# Patient Record
Sex: Male | Born: 1995 | Race: Black or African American | Hispanic: No | Marital: Single | Smoking: Never smoker
Health system: Southern US, Community
[De-identification: ages and names within clinical notes are randomized; demographics above are authoritative.]

## PROBLEM LIST (undated history)

## (undated) ENCOUNTER — Emergency Department (HOSPITAL_COMMUNITY): Admission: EM | Payer: 59

## (undated) DIAGNOSIS — M419 Scoliosis, unspecified: Secondary | ICD-10-CM

## (undated) DIAGNOSIS — M2140 Flat foot [pes planus] (acquired), unspecified foot: Secondary | ICD-10-CM

## (undated) HISTORY — PX: FOOT SURGERY: SHX648

## (undated) HISTORY — PX: HIP FRACTURE SURGERY: SHX118

---
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
## 2013-12-19 ENCOUNTER — Encounter (HOSPITAL_COMMUNITY): Payer: Self-pay | Admitting: Emergency Medicine

## 2013-12-19 ENCOUNTER — Emergency Department (HOSPITAL_COMMUNITY)
Admission: EM | Admit: 2013-12-19 | Discharge: 2013-12-19 | Disposition: A | Discharge status: Home / Self Care | Payer: Federal, State, Local not specified - PPO | Attending: Emergency Medicine | Admitting: Emergency Medicine

## 2013-12-19 DIAGNOSIS — K088 Other specified disorders of teeth and supporting structures: Secondary | ICD-10-CM | POA: Insufficient documentation

## 2013-12-19 DIAGNOSIS — K029 Dental caries, unspecified: Secondary | ICD-10-CM | POA: Insufficient documentation

## 2013-12-19 DIAGNOSIS — K0889 Other specified disorders of teeth and supporting structures: Secondary | ICD-10-CM

## 2013-12-19 MED ORDER — ONDANSETRON HCL 4 MG PO TABS
4.0000 mg | ORAL_TABLET | Freq: Once | ORAL | Status: AC
  Administered 2013-12-19: 4 mg via ORAL
  Filled 2013-12-19: qty 1

## 2013-12-19 MED ORDER — ACETAMINOPHEN-CODEINE #3 300-30 MG PO TABS: 1.0000 | ORAL_TABLET | Freq: Four times a day (QID) | ORAL | Status: DC | PRN

## 2013-12-19 MED ORDER — ACETAMINOPHEN-CODEINE #3 300-30 MG PO TABS
2.0000 | ORAL_TABLET | Freq: Once | ORAL | Status: AC
Start: 2013-12-19 — End: 2013-12-19
  Administered 2013-12-19: 2 via ORAL
  Filled 2013-12-19: qty 2

## 2013-12-19 MED ORDER — AMOXICILLIN 250 MG PO CAPS
500.0000 mg | ORAL_CAPSULE | Freq: Once | ORAL | Status: AC
  Administered 2013-12-19: 500 mg via ORAL
  Filled 2013-12-19: qty 2

## 2013-12-19 MED ORDER — AMOXICILLIN 500 MG PO CAPS: 500.0000 mg | ORAL_CAPSULE | Freq: Three times a day (TID) | ORAL | Status: DC

## 2013-12-19 NOTE — Discharge Instructions (Signed)
Dental Pain  Toothache is pain in or around a tooth. It may get worse with chewing or with cold or heat.   HOME CARE  · Your dentist may use a numbing medicine during treatment. If so, you may need to avoid eating until the medicine wears off. Ask your dentist about this.  · Only take medicine as told by your dentist or doctor.  · Avoid chewing food near the painful tooth until after all treatment is done. Ask your dentist about this.  GET HELP RIGHT AWAY IF:   · The problem gets worse or new problems appear.  · You have a fever.  · There is redness and puffiness (swelling) of the face, jaw, or neck.  · You cannot open your mouth.  · There is pain in the jaw.  · There is very bad pain that is not helped by medicine.  MAKE SURE YOU:   · Understand these instructions.  · Will watch your condition.  · Will get help right away if you are not doing well or get worse.  Document Released: 06/06/2007 Document Revised: 03/12/2011 Document Reviewed: 06/06/2007  Exit[employer]® Patient Information ©2015 [employer]. This information is not intended to replace advice given to you by your health care provider. Make sure you discuss any questions you have with your health care provider.

## 2013-12-19 NOTE — ED Notes (Signed)
Patient c/o right lower dental pain. Patient reports gurgling salt water and taking, tylenol, ibuprofen, and aspirin with some relief.

## 2013-12-19 NOTE — ED Provider Notes (Signed)
CSN: [id number]     Arrival date & time 12/19/13  1331 History   None    Chief Complaint  Patient presents with  . Dental Pain     (Consider location/radiation/quality/duration/timing/severity/associated sxs/prior Treatment) Patient is a [age] y.o. male presenting with tooth pain. The history is provided by the patient.  Dental Pain Location:  Lower Severity:  Moderate Onset quality:  Gradual Timing:  Intermittent Progression:  Worsening Context: dental caries   Relieved by:  Nothing Worsened by:  Cold food/drink Ineffective treatments:  Acetaminophen and NSAIDs Associated symptoms: gum swelling   Associated symptoms: no drooling, no neck pain and no trismus   Risk factors: no diabetes and no smoking     History reviewed. No pertinent past medical history. Past Surgical History  Procedure Laterality Date  . Foot surgery    . Hip fracture surgery Left    History reviewed. No pertinent family history. History  Substance Use Topics  . Smoking status: Never Smoker   . Smokeless tobacco: Never Used  . Alcohol Use: No    Review of Systems  Constitutional: Negative for activity change.       All ROS Neg except as noted in HPI  HENT: Positive for dental problem. Negative for drooling and nosebleeds.   Eyes: Negative for photophobia and discharge.  Respiratory: Negative for cough, shortness of breath and wheezing.   Cardiovascular: Negative for chest pain and palpitations.  Gastrointestinal: Negative for abdominal pain and blood in stool.  Genitourinary: Negative for dysuria, frequency and hematuria.  Musculoskeletal: Positive for arthralgias. Negative for back pain and neck pain.  Skin: Negative.   Neurological: Negative for dizziness, seizures and speech difficulty.  Psychiatric/Behavioral: Negative for hallucinations and confusion.      Allergies  Review of patient's allergies indicates no known allergies.  Home Medications   Prior to Admission medications    Medication Sig Start Date End Date Taking? Authorizing Provider  acetaminophen (TYLENOL) 500 MG tablet Take 500 mg by mouth every 8 (eight) hours as needed (pain).   Yes Historical Provider, MD  ibuprofen (ADVIL,MOTRIN) 200 MG tablet Take 400 mg by mouth every 8 (eight) hours as needed (pain).   Yes Historical Provider, MD   BP 120/80 mmHg  Pulse 69  Temp(Src) 98 F (36.7 C) (Oral)  Resp 16  SpO2 95% Physical Exam  Constitutional: He is oriented to person, place, and time. He appears well-developed and well-nourished.  Non-toxic appearance.  HENT:  Head: Normocephalic.  Right Ear: Tympanic membrane and external ear normal.  Left Ear: Tympanic membrane and external ear normal.  Mouth/Throat: Uvula is midline. No trismus in the jaw. Abnormal dentition. Dental caries present. No dental abscesses.  Eyes: EOM and lids are normal. Pupils are equal, round, and reactive to light.  Neck: Normal range of motion. Neck supple. Carotid bruit is not present.  Cardiovascular: Normal rate, regular rhythm, normal heart sounds, intact distal pulses and normal pulses.   Pulmonary/Chest: Breath sounds normal. No respiratory distress.  Abdominal: Soft. Bowel sounds are normal. There is no tenderness. There is no guarding.  Musculoskeletal: Normal range of motion.  Lymphadenopathy:       Head (right side): No submandibular adenopathy present.       Head (left side): No submandibular adenopathy present.    He has no cervical adenopathy.  Neurological: He is alert and oriented to person, place, and time. He has normal strength. No cranial nerve deficit or sensory deficit.  Skin: Skin is warm and dry.  Psychiatric: He has a normal mood and affect. His speech is normal.  Nursing note and vitals reviewed.   ED Course  Procedures (including critical care time) Labs Review Labs Reviewed - No data to display  Imaging Review No results found.   EKG Interpretation None      MDM  Patient has some  swelling of the gum on the right. There are multiple dental caries present. No abscess. No swelling under the tongue is appreciated. There is no evidence for Ludwig's angina.  Patient strongly encouraged to see a dentist as sone as possible. Prescription for Tylenol codeine and Amoxil given to the patient.    Final diagnoses:  Toothache    *I have reviewed nursing notes, vital signs, and all appropriate lab and imaging results for this patient.[address]**    Raniya Golembeski Garry HeaterM Stachia Slutsky, PA-C 12/21/13 1805  Enid SkeensJoshua M Zavitz, MD 12/22/13 Lyda [id number]

## 2017-07-16 ENCOUNTER — Emergency Department (HOSPITAL_COMMUNITY): Payer: Federal, State, Local not specified - PPO

## 2017-07-16 ENCOUNTER — Emergency Department (HOSPITAL_COMMUNITY)
Admission: EM | Admit: 2017-07-16 | Discharge: 2017-07-16 | Disposition: A | Discharge status: Home / Self Care | Payer: Federal, State, Local not specified - PPO | Attending: Emergency Medicine | Admitting: Emergency Medicine

## 2017-07-16 ENCOUNTER — Other Ambulatory Visit: Payer: Self-pay

## 2017-07-16 ENCOUNTER — Encounter (HOSPITAL_COMMUNITY): Payer: Self-pay | Admitting: *Deleted

## 2017-07-16 DIAGNOSIS — X500XXA Overexertion from strenuous movement or load, initial encounter: Secondary | ICD-10-CM | POA: Insufficient documentation

## 2017-07-16 DIAGNOSIS — S8991XA Unspecified injury of right lower leg, initial encounter: Secondary | ICD-10-CM

## 2017-07-16 DIAGNOSIS — Y929 Unspecified place or not applicable: Secondary | ICD-10-CM | POA: Insufficient documentation

## 2017-07-16 DIAGNOSIS — Y999 Unspecified external cause status: Secondary | ICD-10-CM | POA: Insufficient documentation

## 2017-07-16 DIAGNOSIS — Y9383 Activity, rough housing and horseplay: Secondary | ICD-10-CM | POA: Insufficient documentation

## 2017-07-16 HISTORY — DX: Scoliosis, unspecified: M41.9

## 2017-07-16 HISTORY — DX: Flat foot (pes planus) (acquired), unspecified foot: M21.40

## 2017-07-16 MED ORDER — IBUPROFEN 800 MG PO TABS
800.0000 mg | ORAL_TABLET | Freq: Once | ORAL | Status: AC
  Administered 2017-07-16: 800 mg via ORAL
  Filled 2017-07-16: qty 1

## 2017-07-16 NOTE — ED Provider Notes (Signed)
[facility] EMERGENCY DEPARTMENT Provider Note   CSN: [id number] Arrival date & time: 07/16/17  7829     History   Chief Complaint Chief Complaint  Patient presents with  . Knee Injury    HPI David Hanna is a 22 y.o. male with a history of scoliosis who presents to the emergency department status post right knee injury at 1700 complaining of discomfort.  Patient states he was horsing around with his brother when he fell onto the right knee.  He states he got a popping sensation and had immediate onset of discomfort.  States pain is most prominent to the anterior aspect of the knee.  Rates pain a 9 out of 10 in severity, worse with movement and with walking, however he has been able to bear weight.  No specific alleviating factors.  No medications prior to arrival.  No other areas of injury.  No Head injury or loss of consciousness with fall.  Denies numbness, weakness, or tingling.  HPI  Past Medical History:  Diagnosis Date  . Flat foot   . Scoliosis     Patient Active Problem List   Diagnosis Date Noted  . Fracture of wrist 05/11/2010    Past Surgical History:  Procedure Laterality Date  . FOOT SURGERY    . HIP FRACTURE SURGERY Left         Home Medications    Prior to Admission medications   Medication Sig Start Date End Date Taking? Authorizing Provider  acetaminophen (TYLENOL) 500 MG tablet Take 500 mg by mouth every 8 (eight) hours as needed (pain).    [provider]  acetaminophen-codeine (TYLENOL #3) 300-30 MG per tablet Take 1-2 tablets by mouth every 6 (six) hours as needed. 12/19/13   Ivery Quale, PA-C  amoxicillin (AMOXIL) 500 MG capsule Take 1 capsule (500 mg total) by mouth 3 (three) times daily. 12/19/13   Ivery Quale, PA-C  ibuprofen (ADVIL,MOTRIN) 200 MG tablet Take 400 mg by mouth every 8 (eight) hours as needed (pain).    [provider]    Family History No family history on file.  Social History Social History     Tobacco Use  . Smoking status: Never Smoker  . Smokeless tobacco: Never Used  Substance Use Topics  . Alcohol use: No  . Drug use: Never     Allergies   Patient has no known allergies.   Review of Systems Review of Systems  Musculoskeletal: Positive for arthralgias. Negative for back pain and neck pain.  Skin: Negative for color change and wound.  Neurological: Negative for weakness, numbness and headaches.     Physical Exam Updated Vital Signs BP 139/70 (BP Location: Right Arm)   Pulse (!) 58   Temp 98.1 F (36.7 C) (Oral)   Resp 17   Ht 6\' 4"  (1.93 m)   Wt 108.9 kg (240 lb)   SpO2 99%   BMI 29.21 kg/m   Physical Exam  Constitutional: He appears well-developed and well-nourished. No distress.  HENT:  Head: Normocephalic and atraumatic. Head is without raccoon's eyes and without Battle's sign.  Eyes: Pupils are equal, round, and reactive to light. Conjunctivae are normal. Right eye exhibits no discharge. Left eye exhibits no discharge.  Neck: Normal range of motion. Neck supple. No spinous process tenderness present.  Cardiovascular:  Pulses:      Dorsalis pedis pulses are 2+ on the right side, and 2+ on the left side.       Posterior tibial  pulses are 2+ on the right side, and 2+ on the left side.  Pulmonary/Chest: Effort normal and breath sounds normal.  Abdominal: Soft. Normal appearance. He exhibits no distension.  Musculoskeletal:  Upper extremities: Normal range of motion.  Nontender. Back: No midline tenderness to palpation Lower extremities: Some soft tissue swelling is present to anterior right knee.  No open wounds.  No obvious deformity.  No ecchymosis. Patient has normal range of motion to bilateral hips, ankles and the left knee.  Patient's right knee range of motion is somewhat limited, he is able to flex just past 90 degrees, he is able to extend to about 10 degrees (+)- cannot get to fully straight secondary to discomfort.  Compartments are soft.   Patient is tender to palpation diffusely to the anterior knee as well as the medial and lateral aspects.  Tender over the patella as well as the medial and lateral joint line.  Mildly tender to the proximal tibia consistent with tibal tuberosity area.  No tenderness to the fibular head.  No other areas of tenderness to the lower extremities.  Neurovascularly intact distally.  Neurological: He is alert.  Clear speech.  Sensation grossly intact bilateral upper and lower extremities.  Patient has 5 out of 5 strength with knee flexion and extension and ankle plantar dorsiflexion bilaterally.  Symmetric.  Gait is intact but antalgic.  Psychiatric: He has a normal mood and affect. His behavior is normal. Thought content normal.  Nursing note and vitals reviewed.    ED Treatments / Results  Labs (all labs ordered are listed, but only abnormal results are displayed) Labs Reviewed - No data to display  EKG None  Radiology Dg Knee Complete 4 Views Right  Result Date: 07/16/2017 CLINICAL DATA:  Right knee pain after a fall/landing on it wrong today. Initial encounter. EXAM: RIGHT KNEE - COMPLETE 4+ VIEW COMPARISON:  None. FINDINGS: There is a 3 x 1 mm calcific focus at the lower pole of the patella which could represent a tiny avulsion fracture. The proximal portion of the patellar tendon is poorly defined with adjacent fat stranding. There may be a small knee joint effusion. There is no dislocation, and there is no other evidence of fracture. The patella appears slightly high riding. Femorotibial joint space widths are preserved. Several small foci of calcification are noted in the soft tissues lateral to the distal femur, nonspecific. 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 Correlate for signs of patellar tendon injury. Electronically Signed   By: Sebastian Ache M.D.   On: 07/16/2017 19:25    Procedures Procedures (including critical  care time) .Splint Application Date/Time: 07/16/2017 8:26 PM Performed by: ED RN Authorized by: Cherly Anderson, PA-C   Consent:    Consent obtained:  Verbal   Consent given by:  Patient   Risks discussed:  Discoloration, numbness, pain and swelling   Alternatives discussed:  No treatment Pre-procedure details:    Sensation:  Normal Procedure details:    Laterality:  Right   Location:  Knee   Knee:  R knee   Splint type:  Knee immobilizer Post-procedure details:    Sensation:  Normal   Patient tolerance of procedure:  Tolerated well, no immediate complications   Medications Ordered in ED Medications  ibuprofen (ADVIL,MOTRIN) tablet 800 mg (800 mg Oral Given 07/16/17 2047)     Initial Impression / Assessment and Plan / ED Course  I have reviewed the  triage vital signs and the nursing notes.  Pertinent labs & imaging results that were available during my care of the patient were reviewed by me and considered in my medical decision making (see chart for details).   Patient presents to the emergency department status post right knee injury.  Patient nontoxic-appearing, no apparent distress, vitals without significant abnormality.  No evidence of serious head neck or back injury status post fall.  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 Correlate for signs of patellar tendon injury-patient is able to extend/flex the right knee (mild limitation in ROM), he has 5 out of 5 symmetric strength with flexion and extension therefore do not suspect complete tear of patellar tendon. He is NVI distally. Will place in knee immobilizer, crutches, PRICE, and ibuprofen with orthopedics follow up. I discussed results, treatment plan, need for orthopedics follow-up, and return precautions with the patient and his mother at bedside. Provided opportunity for questions, patient and his mother  confirmed understanding and are in agreement with plan.   Findings and plan of care discussed with supervising physician Dr. Particia NearingHaviland- in agreement.    Final Clinical Impressions(s) / ED Diagnoses   Final diagnoses:  Injury of right knee, initial encounter    ED Discharge Orders    None       Desmond Lopeetrucelli, Dejha King R, PA-C 07/16/17 2138    Jacalyn LefevreHaviland, Julie, MD 07/16/17 2327

## 2017-07-16 NOTE — Discharge Instructions (Signed)
Please read and follow all provided instructions.  You have been seen today for an injury to your right knee   Tests performed today include: An x-ray of the affected area - shows that there is a possible small avulsion fracture of the patella (the bone in the front of your knee)  Vital signs. See below for your results today.   Home care instructions: -- *PRICE in the first 24-48 hours after injury: Protect (with brace, splint, sling), if given by your provider- please wear brace at all times until you have seen orthopedic doctor.  Rest- do not participate in strenuous activities or sports until you have seen the orthopedic doctor.  Ice- Do not apply ice pack directly to your skin, place towel or similar between your skin and ice/ice pack. Apply ice for 20 min, then remove for 40 min while awake Compression- Wear brace, elastic bandage, splint as directed by your provider Elevate affected extremity above the level of your heart when not walking around for the first 24-48 hours   Use Ibuprofen (Motrin/Advil) 600mg  every 6 hours as needed for pain (do not exceed max dose in 24 hours, 2400mg )  Follow-up instructions: Please follow-up with your primary care provider or the provided orthopedic physician in 3-5 days for re-evaluation. Remain in the brace and avoid strenuous activity until you have seen this doctor. Call the office to make an appointment.   Return instructions:  Please return if your toes or feet are numb or tingling, appear gray or blue, or you have severe pain (also elevate the leg and loosen splint or wrap if you were given one) Please return to the Emergency Department if you experience worsening symptoms.  Please return if you have any other emergent concerns. Additional Information:  Your vital signs today were: BP 139/70 (BP Location: Right Arm)    Pulse (!) 58    Temp 98.1 F (36.7 C) (Oral)    Resp 17    Ht 6\' 4"  (1.93 m)    Wt 108.9 kg (240 lb)    SpO2 99%    BMI  29.21 kg/m  If your blood pressure (BP) was elevated above 135/85 this visit, please have this repeated by your doctor within one month. ---------------

## 2017-07-16 NOTE — ED Triage Notes (Signed)
Pt c/o right knee pain after falling and "landing wrong" on it.

## 2019-02-01 IMAGING — DX DG KNEE COMPLETE 4+V*R*
4 series · 4 of 4 positions shown · non-contrast
Comparison: None.

CLINICAL DATA: Right knee pain after a fall/landing on it wrong
today. Initial encounter.

EXAM:
RIGHT KNEE - COMPLETE 4+ VIEW

[knee ap]
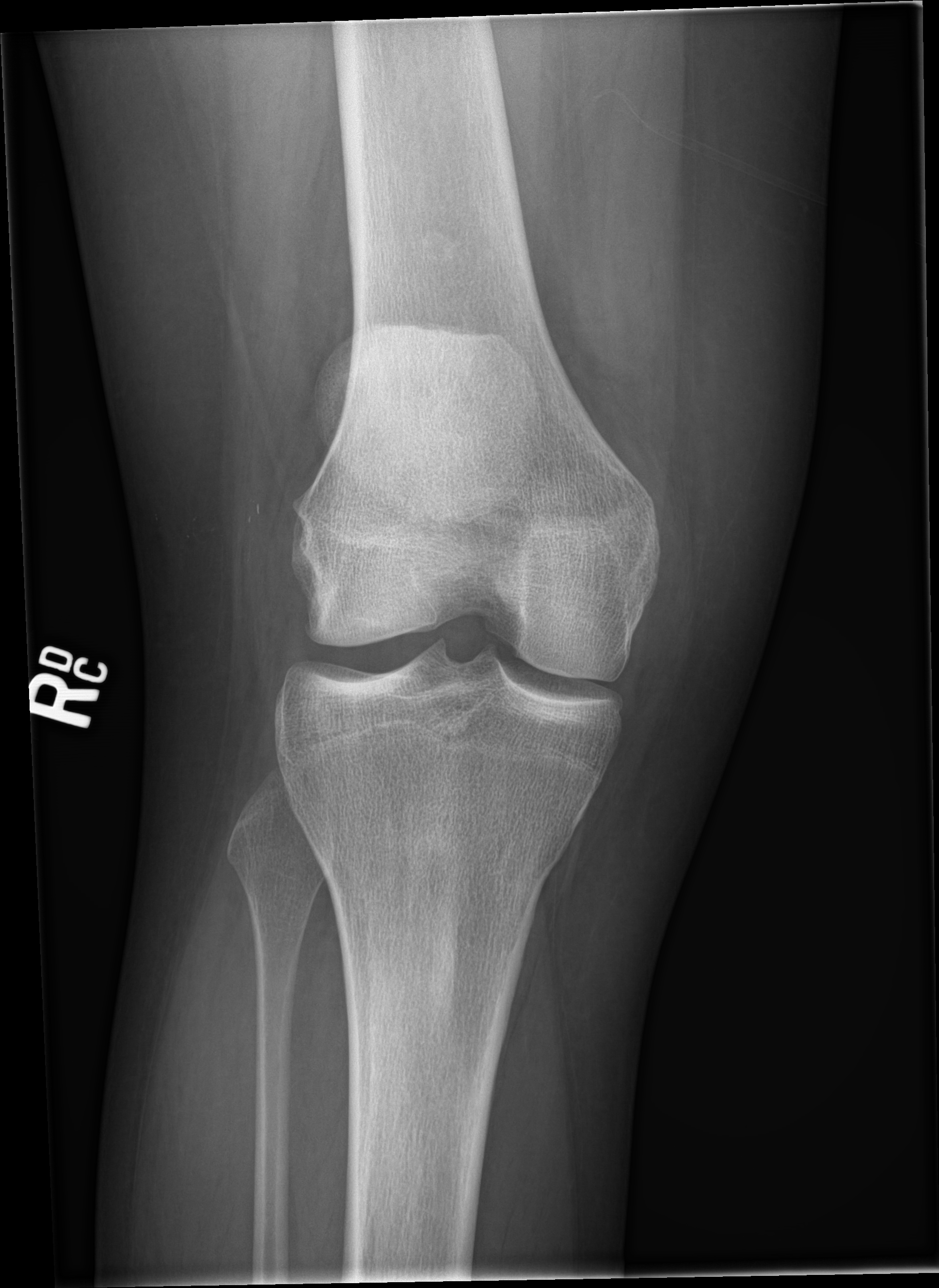

[knee obl (1 of 2)]
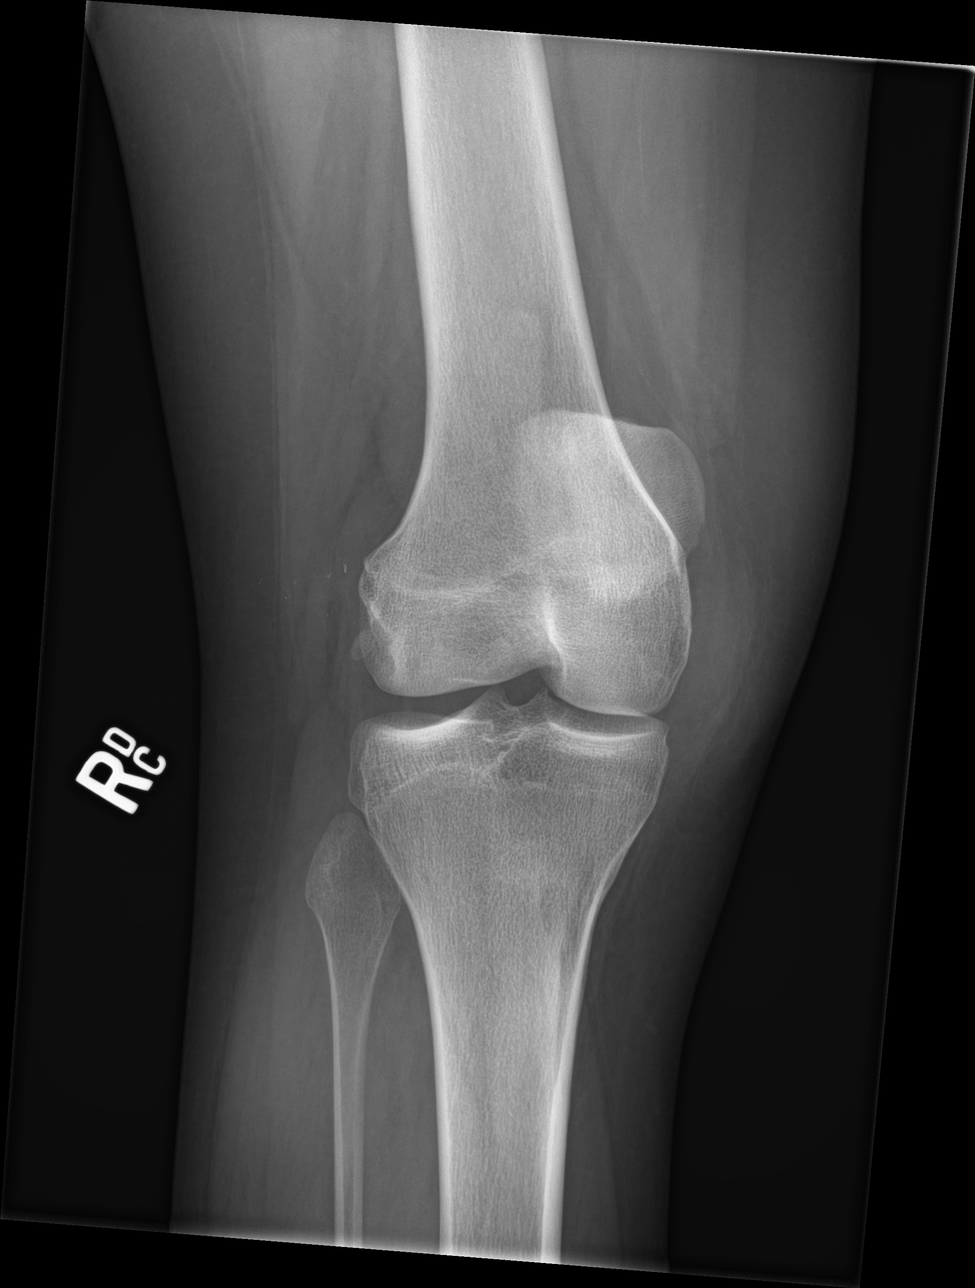

[knee obl (2 of 2)]
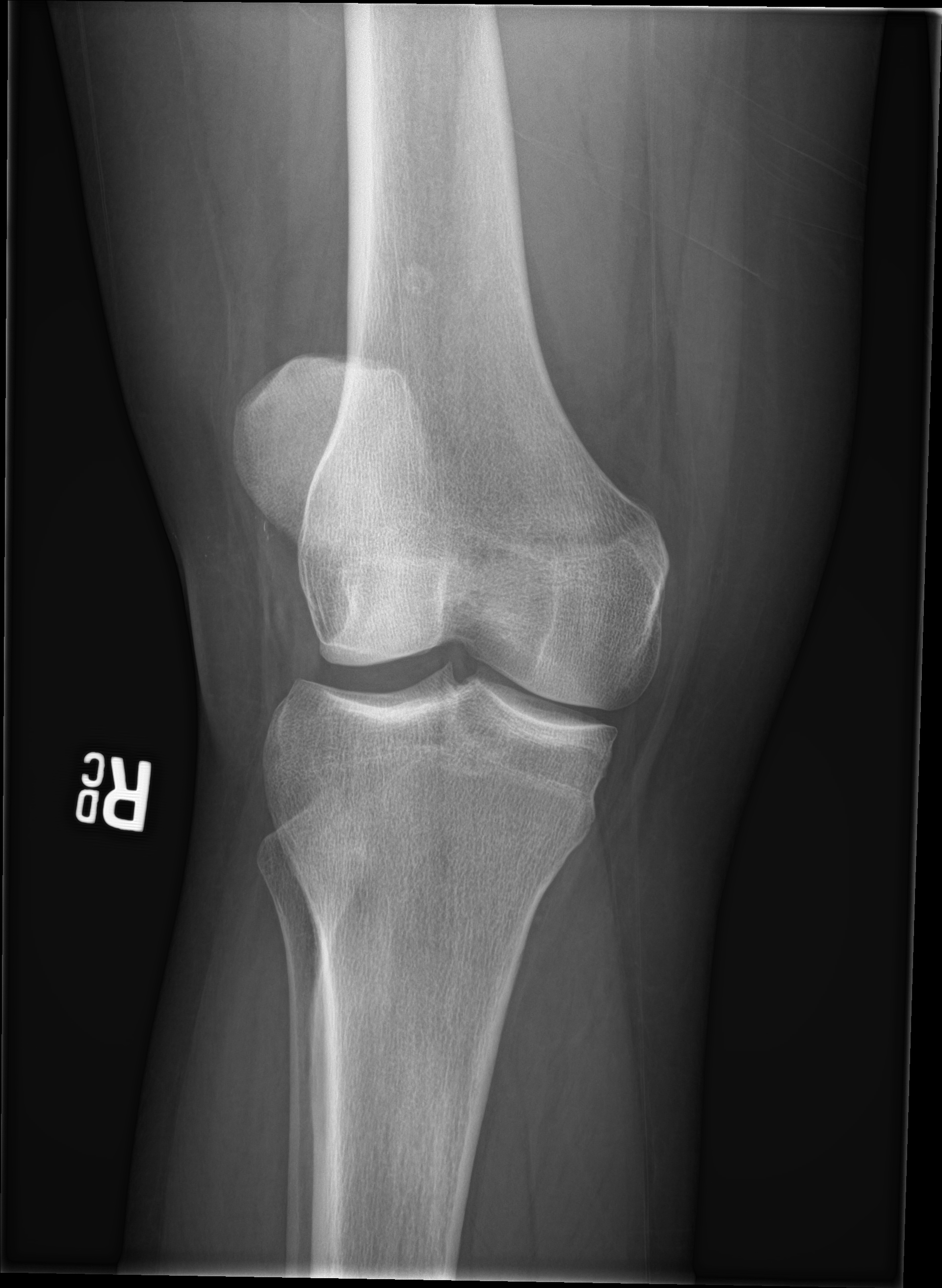

[knee lat]
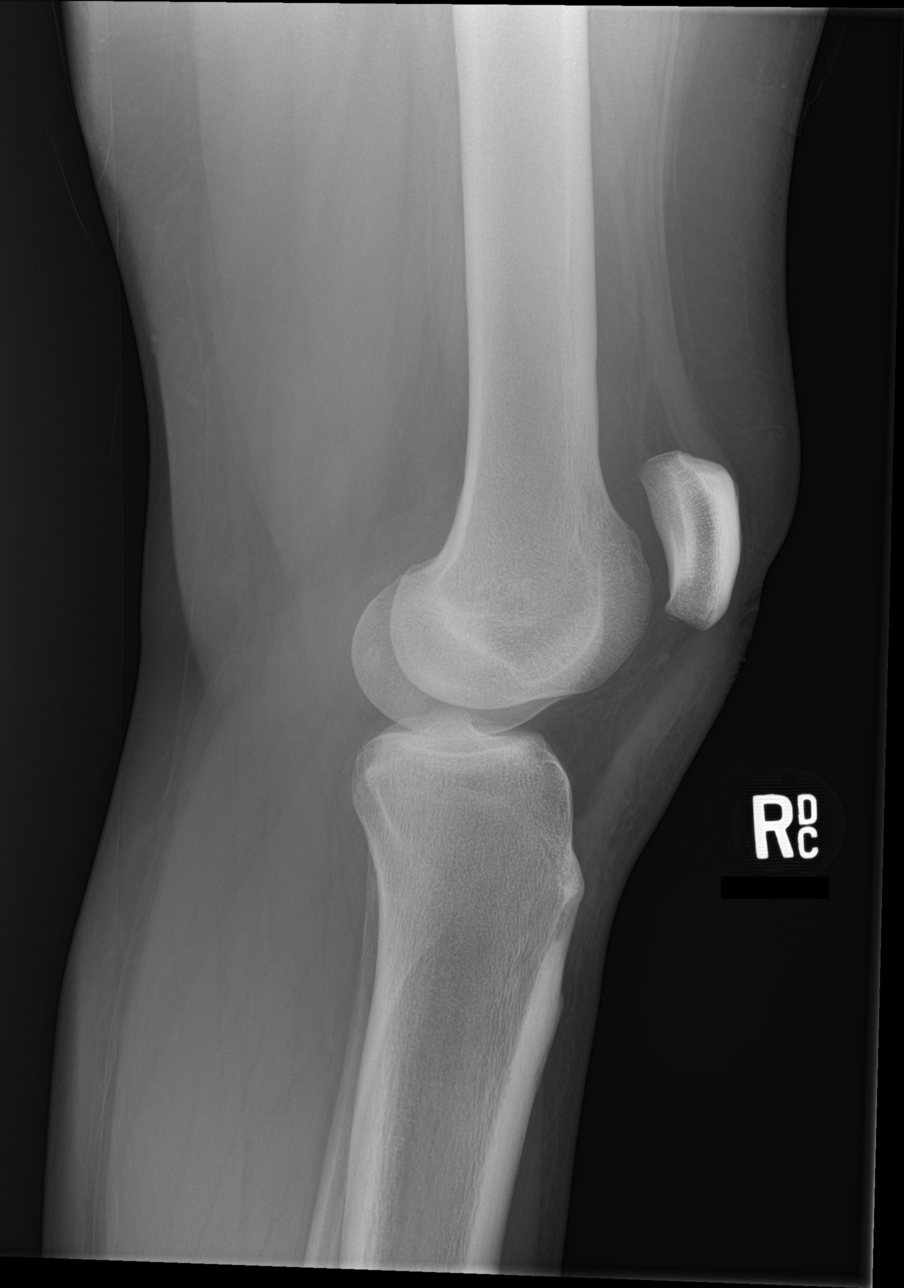

[4 of 4 positions shown; findings below may reference images not displayed]

FINDINGS: There is a 3 x 1 mm calcific focus at the lower pole of the patella
which could represent a tiny avulsion fracture. The proximal portion
of the patellar tendon is poorly defined with adjacent fat
stranding. There may be a small knee joint effusion. There is no
dislocation, and there is no other evidence of fracture. The patella
appears slightly high riding. Femorotibial joint space widths are
preserved. Several small foci of calcification are noted in the soft
tissues lateral to the distal femur, nonspecific.
IMPRESSION: Possible avulsion fracture of the lower pole of the patella with
borderline to mild patella alta. Correlate for signs of patellar
tendon injury.

## 2019-02-06 ENCOUNTER — Other Ambulatory Visit: Payer: Self-pay

## 2019-02-06 ENCOUNTER — Ambulatory Visit: Payer: Federal, State, Local not specified - PPO | Attending: Internal Medicine

## 2019-02-06 DIAGNOSIS — Z20822 Contact with and (suspected) exposure to covid-19: Secondary | ICD-10-CM

## 2019-02-08 LAB — NOVEL CORONAVIRUS, NAA: SARS-CoV-2, NAA: NOT DETECTED

## 2019-04-11 ENCOUNTER — Ambulatory Visit: Payer: Federal, State, Local not specified - PPO | Attending: Internal Medicine

## 2019-04-11 DIAGNOSIS — Z23 Encounter for immunization: Secondary | ICD-10-CM

## 2019-04-11 NOTE — Progress Notes (Signed)
   Covid-19 Vaccination Clinic  Name:  David Hanna    MRN: [id number] DOB: 05-07-1995  04/11/2019  [name] was observed post Covid-19 immunization for 15 minutes without incident. He was provided with Vaccine Information Sheet and instruction to access the V-Safe system.   [name] was instructed to call 911 with any severe reactions post vaccine: Marland Kitchen Difficulty breathing  . Swelling of face and throat  . A fast heartbeat  . A bad rash all over body  . Dizziness and weakness   Immunizations Administered    Name Date Dose VIS Date Route   Moderna COVID-19 Vaccine 04/11/2019 12:14 PM 0.5 mL 12/02/2018 Intramuscular   Manufacturer: Moderna   Lot: [id number]   NDC: 34193-790-24

## 2019-05-09 ENCOUNTER — Ambulatory Visit: Payer: Federal, State, Local not specified - PPO

## 2019-05-09 ENCOUNTER — Ambulatory Visit: Payer: Federal, State, Local not specified - PPO | Attending: Internal Medicine

## 2019-05-09 DIAGNOSIS — Z23 Encounter for immunization: Secondary | ICD-10-CM

## 2019-05-09 NOTE — Progress Notes (Signed)
   Covid-19 Vaccination Clinic  Name:  KARSTEN HOWRY    MRN: [id number] DOB: 1995/08/06  05/09/2019  Mr. Reicher was observed post Covid-19 immunization for 15 minutes without incident. He was provided with Vaccine Information Sheet and instruction to access the V-Safe system.   Mr. Begay was instructed to call 911 with any severe reactions post vaccine: Marland Kitchen Difficulty breathing  . Swelling of face and throat  . A fast heartbeat  . A bad rash all over body  . Dizziness and weakness   Immunizations Administered    Name Date Dose VIS Date Route   Moderna COVID-19 Vaccine 05/09/2019 11:16 AM 0.5 mL 12/2018 Intramuscular   Manufacturer: Moderna   Lot: [id number]   NDC: 46047-998-72

## 2021-03-10 ENCOUNTER — Ambulatory Visit
Admission: EM | Admit: 2021-03-10 | Discharge: 2021-03-10 | Disposition: A | Discharge status: Home / Self Care | Payer: 59 | Attending: Family Medicine | Admitting: Family Medicine

## 2021-03-10 ENCOUNTER — Other Ambulatory Visit: Payer: Self-pay

## 2021-03-10 DIAGNOSIS — K148 Other diseases of tongue: Secondary | ICD-10-CM

## 2021-03-10 MED ORDER — NYSTATIN 100000 UNIT/ML MT SUSP: 500000.0000 [IU] | Freq: Four times a day (QID) | OROMUCOSAL | 0 refills | Status: DC

## 2021-03-10 MED ORDER — LIDOCAINE VISCOUS HCL 2 % MT SOLN: 10.0000 mL | OROMUCOSAL | 0 refills | Status: DC | PRN

## 2021-03-10 NOTE — ED Triage Notes (Signed)
Pt presents with c/o right side tongue lesion that began this week   ?

## 2021-03-10 NOTE — ED Provider Notes (Signed)
?[facility] ? ? ? ?CSN: [id number] ?Arrival date & time: 03/10/21  1656 ? ? ?  ? ?History   ?Chief Complaint ?No chief complaint on file. ? ? ?HPI ?David Hanna is a 26 y.o. male.  ? ?Presenting today with a white patch to the right side of his tongue with some mild swelling and pain to the area that started about 3 days ago.  He thinks he may have bit the area in his sleep but he is unsure.  He denies drainage from the area, fever, chills, throat itching or swelling, difficulty breathing or swallowing, fever, dental issues.  Has been using Listerine and Oral-B mouth rinse with minimal relief.  Denies chewing or using other tobacco products.  ? ? ?Past Medical History:  ?Diagnosis Date  ? Flat foot   ? Scoliosis   ? ? ?Patient Active Problem List  ? Diagnosis Date Noted  ? Fracture of wrist 05/11/2010  ? ? ?Past Surgical History:  ?Procedure Laterality Date  ? FOOT SURGERY    ? HIP FRACTURE SURGERY Left   ? ? ? ? ? ?Home Medications   ? ?Prior to Admission medications   ?Medication Sig Start Date End Date Taking? Authorizing Provider  ?lidocaine (XYLOCAINE) 2 % solution Use as directed 10 mLs in the mouth or throat every 3 (three) hours as needed for mouth pain. 03/10/21  Yes Particia Nearing, PA-C  ?nystatin (MYCOSTATIN) 100000 UNIT/ML suspension Take 5 mLs (500,000 Units total) by mouth 4 (four) times daily. 03/10/21  Yes Particia Nearing, PA-C  ?acetaminophen (TYLENOL) 500 MG tablet Take 500 mg by mouth every 8 (eight) hours as needed (pain).    [provider]  ?acetaminophen-codeine (TYLENOL #3) 300-30 MG per tablet Take 1-2 tablets by mouth every 6 (six) hours as needed. 12/19/13   Ivery Quale, PA-C  ?amoxicillin (AMOXIL) 500 MG capsule Take 1 capsule (500 mg total) by mouth 3 (three) times daily. 12/19/13   Ivery Quale, PA-C  ?ibuprofen (ADVIL,MOTRIN) 200 MG tablet Take 400 mg by mouth every 8 (eight) hours as needed (pain).    [provider]  ? ? ?Family  History ?History reviewed. No pertinent family history. ? ?Social History ?Social History  ? ?Tobacco Use  ? Smoking status: Never  ? Smokeless tobacco: Never  ?Substance Use Topics  ? Alcohol use: No  ? Drug use: Never  ? ? ? ?Allergies   ?Patient has no known allergies. ? ? ?Review of Systems ?Review of Systems ?Per HPI ? ?Physical Exam ?Triage Vital Signs ?ED Triage Vitals  ?Enc Vitals Group  ?   BP 03/10/21 1759 117/78  ?   Pulse Rate 03/10/21 1759 63  ?   Resp 03/10/21 1759 18  ?   Temp 03/10/21 1759 97.9 ?F (36.6 ?C)  ?   Temp src --   ?   SpO2 03/10/21 1759 96 %  ?   Weight --   ?   Height --   ?   Head Circumference --   ?   Peak Flow --   ?   Pain Score 03/10/21 1757 5  ?   Pain Loc --   ?   Pain Edu? --   ?   Excl. in GC? --   ? ?No data found. ? ?Updated Vital Signs ?BP 117/78   Pulse 63   Temp 97.9 ?F (36.6 ?C)   Resp 18   SpO2 96%  ? ?Visual Acuity ?Right Eye Distance:   ?  Left Eye Distance:   ?Bilateral Distance:   ? ?Right Eye Near:   ?Left Eye Near:    ?Bilateral Near:    ? ?Physical Exam ?Vitals and nursing note reviewed.  ?Constitutional:   ?   Appearance: Normal appearance.  ?HENT:  ?   Head: Atraumatic.  ?   Mouth/Throat:  ?   Mouth: Mucous membranes are moist.  ?   Comments: Localized white macular region to the right lateral distal portion of tongue.  No active drainage or ulceration to the area.  Minimal edema surrounding the area ?Eyes:  ?   Extraocular Movements: Extraocular movements intact.  ?   Conjunctiva/sclera: Conjunctivae normal.  ?Cardiovascular:  ?   Rate and Rhythm: Normal rate and regular rhythm.  ?Pulmonary:  ?   Effort: Pulmonary effort is normal. No respiratory distress.  ?Musculoskeletal:     ?   General: Normal range of motion.  ?   Cervical back: Normal range of motion and neck supple.  ?Skin: ?   General: Skin is warm and dry.  ?Neurological:  ?   Mental Status: He is oriented to person, place, and time.  ?Psychiatric:     ?   Mood and Affect: Mood normal.     ?    Thought Content: Thought content normal.     ?   Judgment: Judgment normal.  ? ? ? ?UC Treatments / Results  ?Labs ?(all labs ordered are listed, but only abnormal results are displayed) ?Labs Reviewed - No data to display ? ?EKG ? ? ?Radiology ?No results found. ? ?Procedures ?Procedures (including critical care time) ? ?Medications Ordered in UC ?Medications - No data to display ? ?Initial Impression / Assessment and Plan / UC Course  ?I have reviewed the triage vital signs and the nursing notes. ? ?Pertinent labs & imaging results that were available during my care of the patient were reviewed by me and considered in my medical decision making (see chart for details). ? ?  ? ?Possibly leukoplakia, but will rule out yeast with nystatin and discussed viscous lidocaine for pain relief, salt water gargles.  Oral-B rinse.  Close dental follow-up recommended if not resolving over the next few days. ? ?Final Clinical Impressions(s) / UC Diagnoses  ? ?Final diagnoses:  ?Tongue lesion  ? ? ? ?Discharge Instructions   ? ?  ?The area on your tongue may represent a condition such as leukoplakia, which is often benign but does need to be monitored.  I am sending in a mouthwash in case the area is related to a yeast infection as well as a numbing liquid to dab on the area as needed for pain relief.  Continue salt water gargles and rinses.  Follow-up with your dentist in the next week for recheck of the area.  Return sooner if significantly worsening at any time ? ? ? ?ED Prescriptions   ? ? Medication Sig Dispense Auth. Provider  ? nystatin (MYCOSTATIN) 100000 UNIT/ML suspension Take 5 mLs (500,000 Units total) by mouth 4 (four) times daily. 60 mL Particia Nearing, [place]  ? lidocaine (XYLOCAINE) 2 % solution Use as directed 10 mLs in the mouth or throat every 3 (three) hours as needed for mouth pain. 100 mL Particia Nearing, [place]  ? ?  ? ?PDMP not reviewed this encounter. ?  ?Particia Nearing, PA-C ?03/10/21  1910 ? ?

## 2021-03-10 NOTE — Discharge Instructions (Addendum)
The area on your tongue may represent a condition such as leukoplakia, which is often benign but does need to be monitored.  I am sending in a mouthwash in case the area is related to a yeast infection as well as a numbing liquid to dab on the area as needed for pain relief.  Continue salt water gargles and rinses.  Follow-up with your dentist in the next week for recheck of the area.  Return sooner if significantly worsening at any time ?

## 2021-06-01 ENCOUNTER — Ambulatory Visit: Admission: EM | Admit: 2021-06-01 | Discharge: 2021-06-01 | Discharge status: Absent without leave | Payer: 59

## 2022-03-07 ENCOUNTER — Ambulatory Visit (INDEPENDENT_AMBULATORY_CARE_PROVIDER_SITE_OTHER): Payer: 59 | Admitting: Family Medicine

## 2022-03-07 VITALS — BP 116/75 | HR 61 | Ht 76.25 in | Wt 323.2 lb

## 2022-03-07 DIAGNOSIS — Z13220 Encounter for screening for lipoid disorders: Secondary | ICD-10-CM | POA: Diagnosis not present

## 2022-03-07 DIAGNOSIS — Z0000 Encounter for general adult medical examination without abnormal findings: Secondary | ICD-10-CM

## 2022-03-07 NOTE — Progress Notes (Signed)
   Subjective:    Patient ID: David Hanna, male    DOB: [birth year], 27 y.o.   MRN: [id number]  HPI Patient arrives to establish care. Patient states no concerns or issues toda. Patient relates overall he feels he is doing well The patient comes in today for a wellness visit.    A review of their health history was completed.  A review of medications was also completed.  Any needed refills; is not on any meds  Eating habits: Trying to do much better with eating habits  Falls/  MVA accidents in past few months: Denies any accidents or falls  Regular exercise: Going to the [employer] 3 days a week  Specialist pt sees on regular basis: No specialists  Preventative health issues were discussed.   Additional concerns: Denies any health issues  Did have ankle and knee surgery as a young child and also left hip surgery but none since then Denies any strong family history of any health issues    Review of Systems     Objective:   Physical Exam  General-in no acute distress Eyes-no discharge Lungs-respiratory rate normal, CTA CV-no murmurs,RRR Extremities skin warm dry no edema Neuro grossly normal Behavior normal, alert Extremities no edema he does have some valgus changes in his lower leg  Patient does not smoke or drink denies being depressed    Assessment & Plan:  Adult wellness-complete.wellness physical was conducted today. Importance of diet and exercise were discussed in detail.  Importance of stress reduction and healthy living were discussed.  In addition to this a discussion regarding safety was also covered.  We also reviewed over immunizations and gave recommendations regarding current immunization needed for age.   In addition to this additional areas were also touched on including: Preventative health exams needed:  Colonoscopy not indicated currently Lab work ordered Healthy eating regular physical activity try to bring weight down Follow-up again  within 6 months  Patient was advised yearly wellness exam

## 2022-03-09 ENCOUNTER — Encounter: Payer: Self-pay | Admitting: Family Medicine

## 2022-03-09 LAB — HEMOGLOBIN A1C
Est. average glucose Bld gHb Est-mCnc: 117 mg/dL
Hgb A1c MFr Bld: 5.7 % — ABNORMAL HIGH (ref 4.8–5.6)

## 2022-03-09 LAB — LIPID PANEL
Chol/HDL Ratio: 3.2 ratio (ref 0.0–5.0)
Cholesterol, Total: 136 mg/dL (ref 100–199)
HDL: 42 mg/dL (ref >39)
LDL Chol Calc (NIH): 66 mg/dL (ref 0–99)
Triglycerides: 166 mg/dL — ABNORMAL HIGH (ref 0–149)
VLDL Cholesterol Cal: 28 mg/dL (ref 5–40)

## 2022-03-09 NOTE — Progress Notes (Signed)
Please mail to the patient thank you 

## 2022-04-17 ENCOUNTER — Ambulatory Visit (INDEPENDENT_AMBULATORY_CARE_PROVIDER_SITE_OTHER): Payer: 59

## 2022-04-17 ENCOUNTER — Ambulatory Visit: Payer: 59

## 2022-04-17 DIAGNOSIS — H6122 Impacted cerumen, left ear: Secondary | ICD-10-CM

## 2022-04-17 NOTE — Progress Notes (Signed)
Irrigated left ear

## 2022-09-11 ENCOUNTER — Ambulatory Visit: Payer: 59 | Admitting: Family Medicine

## 2022-09-11 VITALS — BP 118/82 | HR 64 | Temp 98.6°F | Ht 76.25 in | Wt 337.6 lb

## 2022-09-11 DIAGNOSIS — R7309 Other abnormal glucose: Secondary | ICD-10-CM

## 2022-09-11 DIAGNOSIS — Z23 Encounter for immunization: Secondary | ICD-10-CM

## 2022-09-11 NOTE — Progress Notes (Signed)
   Subjective:    Patient ID: David Hanna, male    DOB: [birth year], 27 y.o.   MRN: [id number]  HPI Pt comes in today for follow up visit, no questions or concerns at this time.  Patient is prediabetic A1c was 5.7 he is trying his best to eat healthier.  We did discuss to minimize fried foods also discussed cutting back on potatoes and breads he does do a good job mainly with water  He does try to exercise several times per week Review of Systems     Objective:   Physical Exam  General-in no acute distress Eyes-no discharge Lungs-respiratory rate normal, CTA CV-no murmurs,RRR Extremities skin warm dry no edema Neuro grossly normal Behavior normal, alert       Assessment & Plan:   1. Immunization due Today - Flu vaccine trivalent PF, 6mos and older(Flulaval,Afluria,Fluarix,Fluzone)  2. Elevated hemoglobin A1c Minimize starches regular activity and exercise recommended portion control weight loss recommended follow-up by spring lab work in the spring  3. Morbid obesity (HCC) Portion control regular physical activity lab work in the spring with the physical exam

## 2023-03-11 ENCOUNTER — Ambulatory Visit: Payer: 59 | Admitting: Family Medicine

## 2023-03-15 ENCOUNTER — Other Ambulatory Visit: Payer: Self-pay

## 2023-03-15 ENCOUNTER — Encounter: Payer: Self-pay | Admitting: Emergency Medicine

## 2023-03-15 ENCOUNTER — Ambulatory Visit
Admission: EM | Admit: 2023-03-15 | Discharge: 2023-03-15 | Disposition: A | Discharge status: Home / Self Care | Payer: Self-pay | Attending: Family Medicine | Admitting: Family Medicine

## 2023-03-15 DIAGNOSIS — H6121 Impacted cerumen, right ear: Secondary | ICD-10-CM

## 2023-03-15 NOTE — ED Provider Notes (Signed)
 [facility]    CSN: [id number] Arrival date & time: 03/15/23  1522      History   Chief Complaint Chief Complaint  Patient presents with   Ear Fullness    HPI David Hanna is a 28 y.o. male.   Patient presenting today with right ear fullness for the past 2 weeks.  States he was at a different doctor's appointment this morning and was told he had a wax impaction on the right side but that they could not rinse it out there so he came here to have his ear rinsed out.  Denies bleeding, drainage, loss of hearing, headache.  Uses Q-tips and wax softening drops at home with no relief.    Past Medical History:  Diagnosis Date   Flat foot    Scoliosis     Patient Active Problem List   Diagnosis Date Noted   Fracture of wrist 05/11/2010    Past Surgical History:  Procedure Laterality Date   FOOT SURGERY     HIP FRACTURE SURGERY Left        Home Medications    Prior to Admission medications   Not on File    Family History History reviewed. No pertinent family history.  Social History Social History   Tobacco Use   Smoking status: Never   Smokeless tobacco: Never  Substance Use Topics   Alcohol use: No   Drug use: Never     Allergies   Patient has no known allergies.   Review of Systems Review of Systems PER HPI  Physical Exam Triage Vital Signs ED Triage Vitals [03/15/23 1601]  Encounter Vitals Group     BP 131/89     Systolic BP Percentile      Diastolic BP Percentile      Pulse Rate 61     Resp 20     Temp (!) 97.3 F (36.3 C)     Temp Source Oral     SpO2 96 %     Weight      Height      Head Circumference      Peak Flow      Pain Score 0     Pain Loc      Pain Education      Exclude from Growth Chart    No data found.  Updated Vital Signs BP 131/89 (BP Location: Right Arm)   Pulse 61   Temp (!) 97.3 F (36.3 C) (Oral)   Resp 20   SpO2 96%   Visual Acuity Right Eye Distance:   Left Eye Distance:    Bilateral Distance:    Right Eye Near:   Left Eye Near:    Bilateral Near:     Physical Exam Vitals and nursing note reviewed.  Constitutional:      Appearance: Normal appearance.  HENT:     Head: Atraumatic.     Right Ear: There is impacted cerumen.     Left Ear: Tympanic membrane normal. There is no impacted cerumen.     Ears:     Comments: Right TM visualized and benign post procedure    Nose: Nose normal.  Eyes:     Extraocular Movements: Extraocular movements intact.     Conjunctiva/sclera: Conjunctivae normal.  Cardiovascular:     Rate and Rhythm: Normal rate and regular rhythm.  Pulmonary:     Effort: Pulmonary effort is normal.     Breath sounds: Normal breath sounds.  Musculoskeletal:  General: Normal range of motion.     Cervical back: Normal range of motion and neck supple.  Skin:    General: Skin is warm and dry.  Neurological:     General: No focal deficit present.     Mental Status: He is oriented to person, place, and time.  Psychiatric:        Mood and Affect: Mood normal.        Thought Content: Thought content normal.        Judgment: Judgment normal.      UC Treatments / Results  Labs (all labs ordered are listed, but only abnormal results are displayed) Labs Reviewed - No data to display  EKG   Radiology No results found.  Procedures Procedures (including critical care time)  Medications Ordered in UC Medications - No data to display  Initial Impression / Assessment and Plan / UC Course  I have reviewed the triage vital signs and the nursing notes.  Pertinent labs & imaging results that were available during my care of the patient were reviewed by me and considered in my medical decision making (see chart for details).     Lavage performed to the right ear with warm water and peroxide with full clearance of wax impaction.  TM visualized and benign post procedure.  Patient tolerated procedure well.  Final Clinical  Impressions(s) / UC Diagnoses   Final diagnoses:  Impacted cerumen of right ear   Discharge Instructions   None    ED Prescriptions   None    PDMP not reviewed this encounter.   Particia Nearing, [place] 03/15/23 1640

## 2023-03-15 NOTE — ED Triage Notes (Signed)
 Pt reports ear fullness x2 weeks.

## 2023-06-18 ENCOUNTER — Ambulatory Visit: Payer: Self-pay | Admitting: Family Medicine

## 2023-06-20 ENCOUNTER — Encounter: Payer: Self-pay | Admitting: Family Medicine
